# Patient Record
Sex: Male | Born: 1945 | Race: White | Hispanic: No | Marital: Married | Smoking: Former smoker
Health system: Southern US, Community
[De-identification: ages and names within clinical notes are randomized; demographics above are authoritative.]

## PROBLEM LIST (undated history)

## (undated) DIAGNOSIS — I10 Essential (primary) hypertension: Secondary | ICD-10-CM

## (undated) DIAGNOSIS — I2510 Atherosclerotic heart disease of native coronary artery without angina pectoris: Secondary | ICD-10-CM

## (undated) DIAGNOSIS — E785 Hyperlipidemia, unspecified: Secondary | ICD-10-CM

## (undated) HISTORY — PX: TONSILLECTOMY: SUR1361

## (undated) HISTORY — PX: MOUTH SURGERY: SHX715

## (undated) HISTORY — PX: CYST REMOVAL TRUNK: SHX6283

## (undated) HISTORY — PX: VASECTOMY: SHX75

## (undated) HISTORY — DX: Hyperlipidemia, unspecified: E78.5

## (undated) HISTORY — DX: Morbid (severe) obesity due to excess calories: E66.01

---

## 1994-07-26 HISTORY — PX: CORONARY ARTERY BYPASS GRAFT: SHX141

## 2001-06-30 ENCOUNTER — Encounter: Admission: RE | Admit: 2001-06-30 | Discharge: 2001-06-30 | Payer: Self-pay | Admitting: Family Medicine

## 2001-06-30 ENCOUNTER — Encounter: Payer: Self-pay | Admitting: Family Medicine

## 2001-08-15 ENCOUNTER — Encounter: Payer: Self-pay | Admitting: Otolaryngology

## 2001-08-16 ENCOUNTER — Ambulatory Visit (HOSPITAL_COMMUNITY): Admission: RE | Admit: 2001-08-16 | Discharge: 2001-08-17 | Payer: Self-pay | Admitting: Otolaryngology

## 2001-08-16 ENCOUNTER — Encounter (INDEPENDENT_AMBULATORY_CARE_PROVIDER_SITE_OTHER): Payer: Self-pay | Admitting: Specialist

## 2004-12-23 ENCOUNTER — Ambulatory Visit (HOSPITAL_COMMUNITY): Admission: RE | Admit: 2004-12-23 | Discharge: 2004-12-23 | Payer: Self-pay | Admitting: Orthopedic Surgery

## 2004-12-30 ENCOUNTER — Encounter (INDEPENDENT_AMBULATORY_CARE_PROVIDER_SITE_OTHER): Payer: Self-pay | Admitting: *Deleted

## 2004-12-30 ENCOUNTER — Ambulatory Visit (HOSPITAL_COMMUNITY): Admission: RE | Admit: 2004-12-30 | Discharge: 2004-12-30 | Payer: Self-pay | Admitting: Orthopedic Surgery

## 2005-10-12 ENCOUNTER — Encounter: Admission: RE | Admit: 2005-10-12 | Discharge: 2005-10-12 | Payer: Self-pay | Admitting: Family Medicine

## 2005-10-27 ENCOUNTER — Encounter: Admission: RE | Admit: 2005-10-27 | Discharge: 2005-12-22 | Payer: Self-pay | Admitting: Internal Medicine

## 2006-02-24 ENCOUNTER — Encounter (HOSPITAL_COMMUNITY): Admission: RE | Admit: 2006-02-24 | Discharge: 2006-04-07 | Payer: Self-pay | Admitting: Cardiology

## 2007-07-07 ENCOUNTER — Emergency Department (HOSPITAL_COMMUNITY): Admission: EM | Admit: 2007-07-07 | Discharge: 2007-07-07 | Payer: Self-pay | Admitting: Emergency Medicine
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
## 2010-12-11 NOTE — Op Note (Signed)
Mitchell Short, Mitchell Short NO.:  [id number]   MEDICAL RECORD NO.:  [id number]          PATIENT TYPE:  OIB   LOCATION:  2899                         FACILITY:  [facility]   PHYSICIAN:  Harvie Junior, M.D.   DATE OF BIRTH:  [birth year]   DATE OF PROCEDURE:  12/30/2004  DATE OF DISCHARGE:                                 OPERATIVE REPORT   PREOPERATIVE DIAGNOSIS:  Mass left elbow.   POSTOPERATIVE DIAGNOSES:  1.  Mass left elbow.  2.  Significant olecranon bursitis, left elbow.   PRINCIPAL PROCEDURES:  1.  Removal of mass left elbow.  2.  Removal of olecranon bursitis, left elbow.   SURGEON:  Harvie Junior, M.D.   ASSISTANT:  Marshia Ly, P.A.   ANESTHESIA:  General anesthesia.   BRIEF HISTORY:  Mr. [name] is a gentleman who has a subcutaneous mass on  his left elbow that was bothering him quite a bit and we talked about  treatment options including observation.  We attempted aspiration in the  office which returned no fluid and at this point it was felt that it was  probably some sort of either a lipoma or a sebaceous cyst and ultimately it  continued to bother him and he wanted to have it removed.  He is brought t  the operating room for this procedure.   DESCRIPTION OF PROCEDURE:  Patient brought to the operating room  and after  adequate anesthesia obtained with general anesthetic, patient placed on the  operating table.  The left elbow was then prepped and draped in usual  sterile fashion.  Following this, a blood pressure tourniquet inflated to  250 mmHg.  The skin was infiltrated with Xylocaine and epinephrine and after  the tourniquet was inflated to 250 mmHg, a small incision was made over the  elbow mass and the mass was excised in total.  Following this, there was  just a significant amount of sort of significant scar-like olecranon bursa  and it was felt that this may have been as much related to his pain as mass.  At this point, an olecranon  bursectomy was performed.  The wound was  copiously irrigated at this point and suctioned  dry.  The skin was closed with a combination of Vicryl and nylon.  A sterile  compressive dressing was applied and the patient was taken to the recovery  room where she was noted to be in satisfactory condition.   ESTIMATED BLOOD LOSS:  None.       JLG/MEDQ  D:  12/30/2004  T:  12/30/2004  Job:  [id number]

## 2010-12-11 NOTE — Op Note (Signed)
[place]  Patient:    Mitchell Short, Mitchell Short Visit Number: [id number] MRN: [id number]          Service Type: DSU Location: [id number] Attending Physician:  Susy Frizzle Dictated by:   Jeannett Senior Pollyann Kennedy, M.D. Proc. Date: 08/16/01 Admit Date:  08/16/2001 Discharge Date: 08/17/2001   CC:         Caryn Bee C. Sydnee Levans, M.D.   Operative Report  PREOPERATIVE DIAGNOSIS:  Soft palate/parapharyngeal space mass.  POSTOPERATIVE DIAGNOSIS:  Soft palate/parapharyngeal space mass.  PROCEDURE: Open biopsy of soft palate/parapharyngeal space mass.  SURGEON:  Jefry H. Pollyann Kennedy, M.D.  ANESTHESIA:  General endotracheal anesthesia.  COMPLICATIONS:  None.  ESTIMATED BLOOD LOSS:  25 cc.  FINDINGS:  Diffuse, firm mass involving the submucosal soft palate and left parapharyngeal space.  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  No complications.  The patient tolerated the procedure well and was awakened, was extubated, and transferred to recovery in stable condition.  INDICATIONS:  This is a 65 year old gentleman who presented with a several-month history of an enlarging growth in the back of his throat on the left side. 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  The risks, benefits, alternatives, and complications to the procedure were explained to the patient. He seemed to understand and agreed to surgery.  PROCEDURE:  The patient was taken to the operating room and placed on the operating table in the supine position.  Following induction of general endotracheal anesthesia, the patient was draped in a standard fashion.  1% Xylocaine with epinephrine was infiltrated into the soft palate mucosa on the left side.  The Crowe-Davis mouth gag was inserted into the oral cavity and used to retract the tongue and mandible and attached to the Mayo stand.  A  red rubber catheter was inserted into the right side of the nose, withdrawn throughthe mouth, and used to retract the soft palate and uvula.  An electrocautery device was used to create a mucosal incision through the oral posterior soft palate mucosa.  Careful dissection through submucosal tissue and muscular fibers was accomplished to approach the mass.  Palpation of the mass initially seemed like it was mobile and could perhaps be removed intraorally.  After further dissection around the mass, it was felt that the deepest extent of it was extending into the parapharyngeal space close to the cranial nerves and the major vessels.  A decision was made to simply perform a TruCut needle biopsy. This was performed.  Two specimens were sent for pathologic evaluation.  This was insufficient material to distinguish between benign versus malignant neoplasm.  Dissecting scissors were then used to cut off a fragment of the tumor, approximately 1 cm in diameter, and this was sent for permanent pathologic evaluation.  Suction cautery was used to provide hemostasis, and the incision was reapproximated with interrupted 4-0 Vicryl sutures.  The pharynx was suctioned.  The patient was then awakened, extubated, and transferred to recovery in stable condition. Dictated by:   Jeannett Senior Pollyann Kennedy, M.D. Attending Physician:  Susy Frizzle DD:  08/17/01 TD:  08/19/01 Job: [id number]

## 2010-12-16 ENCOUNTER — Ambulatory Visit: Payer: 59 | Attending: Family Medicine | Admitting: Physical Therapy

## 2010-12-16 DIAGNOSIS — M25519 Pain in unspecified shoulder: Secondary | ICD-10-CM | POA: Insufficient documentation

## 2010-12-16 DIAGNOSIS — IMO0001 Reserved for inherently not codable concepts without codable children: Secondary | ICD-10-CM | POA: Insufficient documentation

## 2010-12-16 DIAGNOSIS — M25619 Stiffness of unspecified shoulder, not elsewhere classified: Secondary | ICD-10-CM | POA: Insufficient documentation

## 2014-06-15 ENCOUNTER — Encounter (HOSPITAL_BASED_OUTPATIENT_CLINIC_OR_DEPARTMENT_OTHER): Payer: Self-pay | Admitting: *Deleted

## 2014-06-15 ENCOUNTER — Emergency Department (HOSPITAL_BASED_OUTPATIENT_CLINIC_OR_DEPARTMENT_OTHER)
Admission: EM | Admit: 2014-06-15 | Discharge: 2014-06-15 | Disposition: A | Discharge status: Home / Self Care | Payer: Medicare HMO | Attending: Emergency Medicine | Admitting: Emergency Medicine

## 2014-06-15 DIAGNOSIS — K0381 Cracked tooth: Secondary | ICD-10-CM | POA: Insufficient documentation

## 2014-06-15 DIAGNOSIS — Z87891 Personal history of nicotine dependence: Secondary | ICD-10-CM | POA: Diagnosis not present

## 2014-06-15 DIAGNOSIS — K029 Dental caries, unspecified: Secondary | ICD-10-CM | POA: Diagnosis not present

## 2014-06-15 DIAGNOSIS — I10 Essential (primary) hypertension: Secondary | ICD-10-CM | POA: Insufficient documentation

## 2014-06-15 DIAGNOSIS — Z79899 Other long term (current) drug therapy: Secondary | ICD-10-CM | POA: Diagnosis not present

## 2014-06-15 DIAGNOSIS — K046 Periapical abscess with sinus: Secondary | ICD-10-CM | POA: Insufficient documentation

## 2014-06-15 DIAGNOSIS — Z7982 Long term (current) use of aspirin: Secondary | ICD-10-CM | POA: Insufficient documentation

## 2014-06-15 DIAGNOSIS — Z951 Presence of aortocoronary bypass graft: Secondary | ICD-10-CM | POA: Diagnosis not present

## 2014-06-15 DIAGNOSIS — I2510 Atherosclerotic heart disease of native coronary artery without angina pectoris: Secondary | ICD-10-CM | POA: Insufficient documentation

## 2014-06-15 DIAGNOSIS — K047 Periapical abscess without sinus: Secondary | ICD-10-CM

## 2014-06-15 DIAGNOSIS — K088 Other specified disorders of teeth and supporting structures: Secondary | ICD-10-CM | POA: Diagnosis present

## 2014-06-15 HISTORY — DX: Atherosclerotic heart disease of native coronary artery without angina pectoris: I25.10

## 2014-06-15 HISTORY — DX: Essential (primary) hypertension: I10

## 2014-06-15 MED ORDER — OXYCODONE-ACETAMINOPHEN 5-325 MG PO TABS
1.0000 | ORAL_TABLET | Freq: Four times a day (QID) | ORAL | Status: AC | PRN
Start: 2014-06-15 — End: ?

## 2014-06-15 MED ORDER — PENICILLIN V POTASSIUM 500 MG PO TABS: 500.0000 mg | ORAL_TABLET | Freq: Three times a day (TID) | ORAL | Status: DC

## 2014-06-15 MED ORDER — PENICILLIN V POTASSIUM 250 MG PO TABS
500.0000 mg | ORAL_TABLET | Freq: Once | ORAL | Status: AC
  Administered 2014-06-15: 500 mg via ORAL
  Filled 2014-06-15: qty 2

## 2014-06-15 MED ORDER — OXYCODONE-ACETAMINOPHEN 5-325 MG PO TABS
2.0000 | ORAL_TABLET | Freq: Once | ORAL | Status: AC
  Administered 2014-06-15: 2 via ORAL
  Filled 2014-06-15: qty 2

## 2014-06-15 NOTE — Discharge Instructions (Signed)

## 2014-06-15 NOTE — ED Notes (Signed)
Right lower broken tooth- reports facial swelling x 5 days, getting worse

## 2014-06-15 NOTE — ED Provider Notes (Signed)
CSN: [id number]     Arrival date & time 06/15/14  1333 History   First MD Initiated Contact with Patient 06/15/14 1417     Chief Complaint  Patient presents with  . Dental Pain     (Consider location/radiation/quality/duration/timing/severity/associated sxs/prior Treatment) HPI    68 year old male with history of CAD and hypertension who presents complaining of dental pain and facial swelling. Patient reportedly has a chipped tooth to his right lower jaw that has never really bothered him however for the past 5 days he has had progressive worsening sharp achy pain to his tooth and now with associated jaw swelling. Pain is persistent, worsening with chewing and with cold air. He tries taking ibuprofen at home with some relief , he also took a Vicodin this morning but it provides no relief. He is worried of infection. He denies having any fever, chills, severe headache, a pain, throat swelling , or rash.  Past Medical History  Diagnosis Date  . Hypertension   . Coronary artery disease    Past Surgical History  Procedure Laterality Date  . Coronary artery bypass graft    . Cyst removal trunk     No family history on file. History  Substance Use Topics  . Smoking status: Former Games developermoker  . Smokeless tobacco: Never Used  . Alcohol Use: No    Review of Systems  Constitutional: Negative for fever.  HENT: Positive for dental problem and facial swelling.   Skin: Negative for rash and wound.      Allergies  Review of patient's allergies indicates no known allergies.  Home Medications   Prior to Admission medications   Medication Sig Start Date End Date Taking? Authorizing Provider  aspirin 325 MG tablet Take 325 mg by mouth daily.   Yes Historical Provider, MD  LOSARTAN POTASSIUM PO Take by mouth.   Yes Historical Provider, MD  METOPROLOL TARTRATE PO Take by mouth.   Yes Historical Provider, MD  Pravastatin Sodium (PRAVACHOL PO) Take by mouth.   Yes Historical Provider, MD    BP 157/64 mmHg  Pulse 64  Temp(Src) 97.8 F (36.6 C) (Oral)  Resp 20  Ht 6\' 2"  (1.88 m)  Wt 374 lb (169.645 kg)  BMI 48.00 kg/m2  SpO2 95% Physical Exam  Constitutional: He appears well-developed and well-nourished. No distress.  HENT:  Head: Atraumatic.   Significant dental decay at tooth #29 tender to palpation but no surrounding gingival erythema or obvious abscess noted. There is facial swelling along the right mandible region tender to palpation, no trismus.  Eyes: Conjunctivae are normal.  Neck: Normal range of motion. Neck supple.  Neurological: He is alert.  Skin: No rash noted.  Psychiatric: He has a normal mood and affect.    ED Course  Procedures (including critical care time)  2:45 PM  patient with evidence of periapical abscess with facial involvement. No abscess amenable for I&D.   We'll treat with antibiotic, pain medication, and we'll give dental referral.  Labs Review Labs Reviewed - No data to display  Imaging Review No results found.   EKG Interpretation None      MDM   Final diagnoses:  Periapical abscess with facial involvement    BP 157/64 mmHg  Pulse 64  Temp(Src) 97.8 F (36.6 C) (Oral)  Resp 20  Ht 6\' 2"  (1.88 m)  Wt 374 lb (169.645 kg)  BMI 48.00 kg/m2  SpO2 95%    Fayrene HelperBowie Baylyn Sickles, PA-C 06/15/14 1508  Gwyneth SproutWhitney Plunkett, MD 06/15/14 1510

## 2014-07-01 ENCOUNTER — Telehealth: Payer: Self-pay | Admitting: Cardiology

## 2014-07-01 NOTE — Telephone Encounter (Signed)
Shanda BumpsJessica at office of [place] [facility] called with patient in chair. Pt had reported to her that he had been seen by Dr. [place] here (last visit 2007) and had history of quad bypass. She needed to know if pt would require premedication for tooth extraction. Little information found on file for this patient and he has not been followed in some time. Since we could not verify complete pt history, Robbie LisBrittainy Simmons PA recommended dentist's office to treat prophylactically. Additionally, recommended the pt schedule F/U with us for health maintenance.

## 2015-01-09 ENCOUNTER — Encounter: Payer: Self-pay | Admitting: Gastroenterology

## 2015-03-17 ENCOUNTER — Ambulatory Visit (INDEPENDENT_AMBULATORY_CARE_PROVIDER_SITE_OTHER): Payer: Medicare HMO | Admitting: Gastroenterology

## 2015-03-17 ENCOUNTER — Encounter: Payer: Self-pay | Admitting: Gastroenterology

## 2015-03-17 VITALS — BP 122/70 | HR 60 | Ht 71.75 in | Wt 385.5 lb

## 2015-03-17 DIAGNOSIS — R1310 Dysphagia, unspecified: Secondary | ICD-10-CM | POA: Diagnosis not present

## 2015-03-17 DIAGNOSIS — Z1211 Encounter for screening for malignant neoplasm of colon: Secondary | ICD-10-CM

## 2015-03-17 MED ORDER — MOVIPREP 100 G PO SOLR: 1.0000 | Freq: Once | ORAL | Status: AC

## 2015-03-17 NOTE — Progress Notes (Signed)
HPI: This is a  very pleasant 69 year old man    who was referred to me by Tracey Harries, MD  to evaluate  dysphasia, globus sensation, colon cancer screening .    Chief complaint is dysphagia, globus, routine risk for colon cancer  Globus type sensation nearly constantly for the past year..  Solid food dysphagia for past year or.  The dysphagia is nonprogressive,. He has had no weight loss.  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  Previous GERD, pyrosis. Take tums periodically.  Formally daily.  Never had colon cancer screening.  No colon cancer, colon polyps    Review of systems: Pertinent positive and negative review of systems were noted in the above HPI section. Complete review of systems was performed and was otherwise normal.   Past Medical History  Diagnosis Date  . Hypertension   . Coronary artery disease   . Morbid obesity with body mass index of 50 or higher   . HLD (hyperlipidemia)     Past Surgical History  Procedure Laterality Date  . Coronary artery bypass graft  1996  . Cyst removal trunk    . 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 Tonsillectomy      Current Outpatient Prescriptions  Medication Sig Dispense Refill  . aspirin 325 MG tablet Take 325 mg by mouth daily.    . hydrochlorothiazide (HYDRODIURIL) 25 MG tablet Take 25 mg by mouth daily.    Marland Kitchen losartan (COZAAR) 100 MG tablet Take 100 mg by mouth daily.    . meloxicam (MOBIC) 15 MG tablet Take 15 mg by mouth as needed for pain.    . metoprolol (LOPRESSOR) 50 MG tablet Take 50 mg by mouth 2 (two) times daily.    Marland Kitchen oxyCODONE-acetaminophen (PERCOCET/ROXICET) 5-325 MG per tablet Take 1 tablet by mouth every 6 (six) hours as needed for moderate pain or severe pain. 14 tablet 0  . pravastatin (PRAVACHOL) 20 MG tablet Take 20 mg by mouth 2 (two) times daily.     No current facility-administered medications for this visit.    Allergies as of 03/17/2015  . (No Known Allergies)     Family History  Problem Relation Age of Onset  . Heart disease Father   . Heart disease Brother     Social History   Social History  . Marital Status: Married    Spouse Name: N/A  . Number of Children: 4  . Years of Education: N/A   Occupational History  . retired    Social History Main Topics  . Smoking status: Former Smoker    Types: Cigarettes    Quit date: 03/16/1976  . Smokeless tobacco: Never Used  . Alcohol Use: No  . Drug Use: No  . Sexual Activity: Not on file   Other Topics Concern  . Not on file   Social History Narrative     Physical Exam: Ht 5' 11.75" (1.822 m)  Wt 385 lb 8 oz (174.862 kg)  BMI 52.67 kg/m2 Constitutional: generally well-appearing Psychiatric: alert and oriented x3 Eyes: extraocular movements intact Mouth: oral pharynx moist, no lesions Neck: supple no lymphadenopathy Cardiovascular: heart regular rate and rhythm Lungs: clear to auscultation bilaterally Abdomen: soft, nontender, nondistended, no obvious ascites, no peritoneal signs, normal bowel sounds Extremities: no lower extremity edema bilaterally Skin: no lesions on visible extremities   Assessment and plan: 69 y.o. male with  morbid obesity, nonprogressive solid food dysphagia, routine risk for  colon cancer.  For his solid food dysphagia I recommended we proceed with EGD at his soonest convenience. He is at routine risk for colon cancer and has never had colon cancer screening. I recommended he have colonoscopy at his soonest convenience as well. Given his morbid, massive obesity with a BMI near 53 these need to be done at [facility] with anesthesia's assistance.   Rob Bunting, MD [facility] 03/17/2015, 11:14 AM  Cc: Tracey Harries, MD

## 2015-03-17 NOTE — Patient Instructions (Addendum)
You will be set up for a colonoscopy at [facility] with MAC sedation given BMI >50. This is for colon cancer screening. You will be set up for an upper endoscopy for dysphagia, globus (same time as colonoscopy). Your body mass index (BMI) is elevated (52).  This is a calculation of your weight/height and it suggests that you are overweight or obese.  You should follow up with your primary caregiver to discuss weight loss strategies.

## 2015-03-18 ENCOUNTER — Encounter (HOSPITAL_COMMUNITY): Payer: Self-pay | Admitting: *Deleted

## 2015-03-24 ENCOUNTER — Telehealth: Payer: Self-pay | Admitting: Gastroenterology

## 2015-03-24 NOTE — Telephone Encounter (Signed)
Yes, that is ok 

## 2015-03-24 NOTE — Telephone Encounter (Signed)
Left message for pt to call back  °

## 2015-03-24 NOTE — Telephone Encounter (Signed)
Pt states he cannot afford Moviprep. Ok to switch pt to suprep with sample? Please advise.

## 2015-03-25 NOTE — Telephone Encounter (Signed)
Pt aware and will pick up suprep and new instructions at the front desk.

## 2015-03-25 NOTE — Telephone Encounter (Signed)
Patient returned phone call. Best # [phone]. Patient is on jury duty today and may not be able to answer message. Ok to leave message.

## 2015-03-26 ENCOUNTER — Telehealth: Payer: Self-pay | Admitting: Gastroenterology

## 2015-03-26 NOTE — Telephone Encounter (Signed)
Appt cancelled with [facility] and Dr. Christella Hartigan aware.

## 2015-03-27 ENCOUNTER — Ambulatory Visit (HOSPITAL_COMMUNITY): Admission: RE | Admit: 2015-03-27 | Payer: Medicare HMO | Source: Ambulatory Visit | Admitting: Gastroenterology

## 2015-03-27 SURGERY — COLONOSCOPY
Anesthesia: Monitor Anesthesia Care

## 2015-03-27 NOTE — Telephone Encounter (Signed)
ok 

## 2015-03-28 ENCOUNTER — Telehealth: Payer: Self-pay | Admitting: Gastroenterology

## 2015-04-01 ENCOUNTER — Other Ambulatory Visit: Payer: Self-pay

## 2015-04-01 DIAGNOSIS — Z1211 Encounter for screening for malignant neoplasm of colon: Secondary | ICD-10-CM

## 2015-04-01 DIAGNOSIS — R1310 Dysphagia, unspecified: Secondary | ICD-10-CM

## 2015-04-01 NOTE — Telephone Encounter (Signed)
Pt has been rescheduled to 9/22 1115 am he is aware and will call with any questions, he was re instructed.

## 2015-04-07 ENCOUNTER — Encounter (HOSPITAL_COMMUNITY): Payer: Self-pay | Admitting: *Deleted

## 2015-04-17 ENCOUNTER — Ambulatory Visit (HOSPITAL_COMMUNITY): Payer: Medicare HMO | Admitting: Certified Registered"

## 2015-04-17 ENCOUNTER — Encounter (HOSPITAL_COMMUNITY)
Admission: RE | Disposition: A | Discharge status: Home / Self Care | Payer: Self-pay | Source: Ambulatory Visit | Attending: Gastroenterology

## 2015-04-17 ENCOUNTER — Encounter (HOSPITAL_COMMUNITY): Payer: Self-pay | Admitting: *Deleted

## 2015-04-17 ENCOUNTER — Ambulatory Visit (HOSPITAL_COMMUNITY)
Admission: RE | Admit: 2015-04-17 | Discharge: 2015-04-17 | Disposition: A | Discharge status: Home / Self Care | Payer: Medicare HMO | Source: Ambulatory Visit | Attending: Gastroenterology | Admitting: Gastroenterology

## 2015-04-17 DIAGNOSIS — K648 Other hemorrhoids: Secondary | ICD-10-CM | POA: Diagnosis not present

## 2015-04-17 DIAGNOSIS — E785 Hyperlipidemia, unspecified: Secondary | ICD-10-CM | POA: Insufficient documentation

## 2015-04-17 DIAGNOSIS — R1310 Dysphagia, unspecified: Secondary | ICD-10-CM | POA: Diagnosis not present

## 2015-04-17 DIAGNOSIS — K5790 Diverticulosis of intestine, part unspecified, without perforation or abscess without bleeding: Secondary | ICD-10-CM | POA: Diagnosis not present

## 2015-04-17 DIAGNOSIS — Z1211 Encounter for screening for malignant neoplasm of colon: Secondary | ICD-10-CM | POA: Diagnosis not present

## 2015-04-17 DIAGNOSIS — Z6841 Body Mass Index (BMI) 40.0 and over, adult: Secondary | ICD-10-CM | POA: Insufficient documentation

## 2015-04-17 DIAGNOSIS — R1013 Epigastric pain: Secondary | ICD-10-CM | POA: Diagnosis not present

## 2015-04-17 DIAGNOSIS — Z87891 Personal history of nicotine dependence: Secondary | ICD-10-CM | POA: Insufficient documentation

## 2015-04-17 DIAGNOSIS — I10 Essential (primary) hypertension: Secondary | ICD-10-CM | POA: Insufficient documentation

## 2015-04-17 DIAGNOSIS — K2970 Gastritis, unspecified, without bleeding: Secondary | ICD-10-CM | POA: Diagnosis not present

## 2015-04-17 DIAGNOSIS — Z951 Presence of aortocoronary bypass graft: Secondary | ICD-10-CM | POA: Diagnosis not present

## 2015-04-17 DIAGNOSIS — K644 Residual hemorrhoidal skin tags: Secondary | ICD-10-CM | POA: Diagnosis not present

## 2015-04-17 DIAGNOSIS — K2950 Unspecified chronic gastritis without bleeding: Secondary | ICD-10-CM | POA: Diagnosis not present

## 2015-04-17 DIAGNOSIS — I2510 Atherosclerotic heart disease of native coronary artery without angina pectoris: Secondary | ICD-10-CM | POA: Insufficient documentation

## 2015-04-17 DIAGNOSIS — K449 Diaphragmatic hernia without obstruction or gangrene: Secondary | ICD-10-CM | POA: Diagnosis not present

## 2015-04-17 HISTORY — PX: ESOPHAGOGASTRODUODENOSCOPY (EGD) WITH PROPOFOL: SHX5813

## 2015-04-17 HISTORY — PX: COLONOSCOPY WITH PROPOFOL: SHX5780

## 2015-04-17 SURGERY — COLONOSCOPY WITH PROPOFOL
Anesthesia: Monitor Anesthesia Care

## 2015-04-17 MED ORDER — LIDOCAINE HCL (CARDIAC) 20 MG/ML IV SOLN
INTRAVENOUS | Status: AC
  Filled 2015-04-17: qty 5

## 2015-04-17 MED ORDER — PROPOFOL 10 MG/ML IV BOLUS
INTRAVENOUS | Status: AC
  Filled 2015-04-17: qty 20

## 2015-04-17 MED ORDER — LACTATED RINGERS IV SOLN
INTRAVENOUS | Status: DC
  Administered 2015-04-17: 1000 mL via INTRAVENOUS

## 2015-04-17 MED ORDER — PROPOFOL 10 MG/ML IV BOLUS
INTRAVENOUS | Status: DC | PRN
  Administered 2015-04-17 (×2): 50 mg via INTRAVENOUS
  Administered 2015-04-17 (×2): 100 mg via INTRAVENOUS

## 2015-04-17 MED ORDER — LIDOCAINE HCL (PF) 2 % IJ SOLN
INTRAMUSCULAR | Status: DC | PRN
  Administered 2015-04-17: 30 mg via INTRADERMAL

## 2015-04-17 MED ORDER — SODIUM CHLORIDE 0.9 % IV SOLN: INTRAVENOUS | Status: DC

## 2015-04-17 SURGICAL SUPPLY — 25 items

## 2015-04-17 NOTE — Anesthesia Postprocedure Evaluation (Signed)
  Anesthesia Post-op Note  Patient: Mitchell Short  Procedure(s) Performed: Procedure(s) (LRB): COLONOSCOPY WITH PROPOFOL (N/A) ESOPHAGOGASTRODUODENOSCOPY (EGD) WITH PROPOFOL (N/A)  Patient Location: PACU  Anesthesia Type: MAC  Level of Consciousness: awake and alert   Airway and Oxygen Therapy: Patient Spontanous Breathing  Post-op Pain: mild  Post-op Assessment: Post-op Vital signs reviewed, Patient's Cardiovascular Status Stable, Respiratory Function Stable, Patent Airway and No signs of Nausea or vomiting  Last Vitals:  Filed Vitals:   04/17/15 1120  BP: 134/48  Pulse: 51  Temp:   Resp: 19    Post-op Vital Signs: stable   Complications: No apparent anesthesia complications

## 2015-04-17 NOTE — H&P (Signed)
  HPI: This is a man with routine risk for colon cancer, never screened. Also dysphagia  Chief complaint is dysphagai   Past Medical History  Diagnosis Date  . Hypertension   . Morbid obesity with body mass index of 50 or higher   . HLD (hyperlipidemia)   . Coronary artery disease     [facility] not seen recently followed by PCP    Past Surgical History  Procedure Laterality Date  . Cyst removal trunk    . 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 Tonsillectomy    . Vasectomy    . Coronary artery bypass graft  1996    no recent changes x4 vessels     Current Facility-Administered Medications  Medication Dose Route Frequency Provider Last Rate Last Dose  . 0.9 %  sodium chloride infusion   Intravenous Continuous Rachael Fee, MD      . lactated ringers infusion   Intravenous Continuous Rachael Fee, MD 10 mL/hr at 04/17/15 0956 1,000 mL at 04/17/15 0956    Allergies as of 04/01/2015  . (No Known Allergies)    Family History  Problem Relation Age of Onset  . Heart disease Father   . Heart disease Brother     Social History   Social History  . Marital Status: Married    Spouse Name: N/A  . Number of Children: 4  . Years of Education: N/A   Occupational History  . retired    Social History Main Topics  . Smoking status: Former Smoker    Types: Cigarettes    Quit date: 03/16/1976  . Smokeless tobacco: Never Used  . Alcohol Use: No  . Drug Use: No  . Sexual Activity: Not on file   Other Topics Concern  . Not on file   Social History Narrative     Physical Exam: BP 156/56 mmHg  Temp(Src) 98 F (36.7 C) (Oral)  Resp 19  Ht 6' (1.829 m)  Wt 375 lb (170.099 kg)  BMI 50.85 kg/m2  SpO2 97% Constitutional: generally well-appearing Psychiatric: alert and oriented x3 Abdomen: soft, nontender, nondistended, no obvious ascites, no peritoneal signs, normal bowel sounds   Assessment and plan: 69 y.o. male with dysphagia,  routine risk for colon cancer, massive obesity  For colonoscopy and EGD today.   Rob Bunting, MD [facility] 04/17/2015, 10:10 AM

## 2015-04-17 NOTE — Discharge Instructions (Signed)

## 2015-04-17 NOTE — Op Note (Signed)
[facility] [address] [place], [address]   ENDOSCOPY PROCEDURE REPORT  PATIENT: Mitchell Short, Mitchell Short  MR#: [id number] BIRTHDATE: 08/26/1945 , 69  yrs. old GENDER: male ENDOSCOPIST: Rachael Fee, MD PROCEDURE DATE:  04/17/2015 PROCEDURE:  EGD w/ biopsy ASA CLASS:     Class III INDICATIONS:  dysphagia, dyspepsia. MEDICATIONS: Monitored anesthesia care TOPICAL ANESTHETIC: none  DESCRIPTION OF PROCEDURE: After the risks benefits and alternatives of the procedure were thoroughly explained, informed consent was obtained.  The Pentax Gastroscope [id number] endoscope was introduced through the mouth and advanced to the second portion of the duodenum , Without limitations.  The instrument was slowly withdrawn as the mucosa was fully examined.    There was moderate distal gastritis with several small erosions and inflamed mucosa.  This area was biospied and sent to pathology. The examination was otherwise normal.  There was a small hiatal hernia.  The scope was then withdrawn from the patient and the procedure completed.  COMPLICATIONS: There were no immediate complications.  ENDOSCOPIC IMPRESSION: Gastritis, biopsied. Hiatal hernia (small)  RECOMMENDATIONS: If the biopsies show H. pylori you will be started on appriate antibiotics. You should chew your food well, eat slowly and take small bites. Take pills sitting up with plenty of water.  eSigned:  Rachael Fee, MD 04/17/2015 11:06 AM    CC: Tracey Harries, MD

## 2015-04-17 NOTE — Transfer of Care (Signed)
Immediate Anesthesia Transfer of Care Note  Patient: Mitchell Short  Procedure(s) Performed: Procedure(s): COLONOSCOPY WITH PROPOFOL (N/A) ESOPHAGOGASTRODUODENOSCOPY (EGD) WITH PROPOFOL (N/A)  Patient Location: PACU  Anesthesia Type:MAC  Level of Consciousness:  sedated, patient cooperative and responds to stimulation  Airway & Oxygen Therapy:Patient Spontanous Breathing   Post-op Assessment:  Report given to PACU RN and Post -op Vital signs reviewed and stable  Post vital signs:  Reviewed and stable  Last Vitals:  Filed Vitals:   04/17/15 0953  BP: 156/56  Temp: 36.7 C  Resp: 19    Complications: No apparent anesthesia complications

## 2015-04-17 NOTE — Op Note (Signed)
[facility] [address] [place], [address]   COLONOSCOPY PROCEDURE REPORT  PATIENT: Mitchell, Short  MR#: [id number] BIRTHDATE: 11/24/1945 , 69  yrs. old GENDER: male ENDOSCOPIST: Rachael Fee, MD REFERRED XB:JYNWG Bouska, M.D. PROCEDURE DATE:  04/17/2015 PROCEDURE:   Colonoscopy, screening First Screening Colonoscopy - Avg.  risk and is 50 yrs.  old or older Yes.  Prior Negative Screening - Now for repeat screening. N/A  History of Adenoma - Now for follow-up colonoscopy & has been > or = to 3 yrs.  N/A  Recommend repeat exam, <10 yrs? No ASA CLASS:   Class III INDICATIONS:Screening for colonic neoplasia and Colorectal Neoplasm Risk Assessment for this procedure is average risk. MEDICATIONS: Monitored anesthesia care  DESCRIPTION OF PROCEDURE:   After the risks benefits and alternatives of the procedure were thoroughly explained, informed consent was obtained.  The digital rectal exam revealed no abnormalities of the rectum.   The Pentax Ped Colon [id number] endoscope was introduced through the anus and advanced to the cecum, which was identified by both the appendix and ileocecal valve. No adverse events experienced.   The quality of the prep was excellent.  The instrument was then slowly withdrawn as the colon was fully examined. Estimated blood loss is zero unless otherwise noted in this procedure report.   COLON FINDINGS: There were numerous diverticulum throughout the colon.  There were small internal and external hemorrhoids.  The examination was otherwise normal.  Retroflexed views revealed no abnormalities. The time to cecum = 0.6 Withdrawal time = 5.3   The scope was withdrawn and the procedure completed. COMPLICATIONS: There were no immediate complications.  ENDOSCOPIC IMPRESSION: There were numerous diverticulum throughout the colon. There were small internal and external hemorrhoids. The examination was otherwise normal; no polyps or  cancers  RECOMMENDATIONS: You should continue to follow colorectal cancer screening guidelines for "routine risk" patients with a repeat colonoscopy in 10 years.   eSigned:  Rachael Fee, MD 04/17/2015 11:01 AM

## 2015-04-17 NOTE — Anesthesia Preprocedure Evaluation (Signed)
Anesthesia Evaluation  Patient identified by MRN, date of birth, ID band Patient awake    Reviewed: Allergy & Precautions, NPO status , Patient's Chart, lab work & pertinent test results, reviewed documented beta blocker date and time   History of Anesthesia Complications Negative for: history of anesthetic complications  Airway Mallampati: III  TM Distance: >3 FB Neck ROM: Full    Dental  (+) Teeth Intact, Dental Advisory Given   Pulmonary former smoker,    Pulmonary exam normal breath sounds clear to auscultation       Cardiovascular hypertension, Pt. on medications and Pt. on home beta blockers (-) angina+ CAD and + CABG  (-) Past MI Normal cardiovascular exam Rhythm:Regular Rate:Normal     Neuro/Psych negative neurological ROS  negative psych ROS   GI/Hepatic negative GI ROS, Neg liver ROS,   Endo/Other  Morbid obesity  Renal/GU negative Renal ROS     Musculoskeletal negative musculoskeletal ROS (+)   Abdominal   Peds  Hematology negative hematology ROS (+)   Anesthesia Other Findings Day of surgery medications reviewed with the patient.  Reproductive/Obstetrics                             Anesthesia Physical Anesthesia Plan  ASA: III  Anesthesia Plan: MAC   Post-op Pain Management:    Induction: Intravenous  Airway Management Planned: Nasal Cannula  Additional Equipment:   Intra-op Plan:   Post-operative Plan:   Informed Consent: I have reviewed the patients History and Physical, chart, labs and discussed the procedure including the risks, benefits and alternatives for the proposed anesthesia with the patient or authorized representative who has indicated his/her understanding and acceptance.   Dental advisory given  Plan Discussed with: CRNA and Anesthesiologist  Anesthesia Plan Comments: (Discussed risks/benefits/alternatives to MAC sedation including need for  ventilatory support, hypotension, need for conversion to general anesthesia.  All patient questions answered.  Patient wished to proceed.)        Anesthesia Quick Evaluation

## 2015-04-18 ENCOUNTER — Encounter (HOSPITAL_COMMUNITY): Payer: Self-pay | Admitting: Gastroenterology

## 2015-05-08 ENCOUNTER — Other Ambulatory Visit: Payer: Self-pay | Admitting: Family Medicine

## 2015-05-08 DIAGNOSIS — Z136 Encounter for screening for cardiovascular disorders: Secondary | ICD-10-CM
